# Patient Record
Sex: Female | Born: 1945 | Race: White | Hispanic: No | Marital: Married | Smoking: Never smoker
Health system: Southern US, Community
[De-identification: ages and names within clinical notes are randomized; demographics above are authoritative.]

## PROBLEM LIST (undated history)

## (undated) DIAGNOSIS — K5790 Diverticulosis of intestine, part unspecified, without perforation or abscess without bleeding: Secondary | ICD-10-CM

## (undated) DIAGNOSIS — F32A Depression, unspecified: Secondary | ICD-10-CM

## (undated) DIAGNOSIS — Z8719 Personal history of other diseases of the digestive system: Secondary | ICD-10-CM

## (undated) DIAGNOSIS — M1990 Unspecified osteoarthritis, unspecified site: Secondary | ICD-10-CM

## (undated) DIAGNOSIS — Z9889 Other specified postprocedural states: Secondary | ICD-10-CM

## (undated) DIAGNOSIS — K219 Gastro-esophageal reflux disease without esophagitis: Secondary | ICD-10-CM

## (undated) DIAGNOSIS — F329 Major depressive disorder, single episode, unspecified: Secondary | ICD-10-CM

## (undated) HISTORY — DX: Major depressive disorder, single episode, unspecified: F32.9

## (undated) HISTORY — DX: Personal history of other diseases of the digestive system: Z87.19

## (undated) HISTORY — DX: Depression, unspecified: F32.A

## (undated) HISTORY — DX: Other specified postprocedural states: Z98.890

## (undated) HISTORY — DX: Unspecified osteoarthritis, unspecified site: M19.90

## (undated) HISTORY — DX: Gastro-esophageal reflux disease without esophagitis: K21.9

## (undated) HISTORY — DX: Diverticulosis of intestine, part unspecified, without perforation or abscess without bleeding: K57.90

---
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
## 1983-08-20 HISTORY — PX: WRIST SURGERY: SHX841

## 2001-08-19 HISTORY — PX: TONSILLECTOMY: SUR1361

## 2005-08-19 DIAGNOSIS — K5790 Diverticulosis of intestine, part unspecified, without perforation or abscess without bleeding: Secondary | ICD-10-CM

## 2005-08-19 DIAGNOSIS — Z8719 Personal history of other diseases of the digestive system: Secondary | ICD-10-CM

## 2005-08-19 HISTORY — DX: Diverticulosis of intestine, part unspecified, without perforation or abscess without bleeding: K57.90

## 2005-08-19 HISTORY — DX: Personal history of other diseases of the digestive system: Z87.19

## 2006-04-22 ENCOUNTER — Ambulatory Visit: Payer: Self-pay | Admitting: Gastroenterology

## 2006-05-27 ENCOUNTER — Encounter (INDEPENDENT_AMBULATORY_CARE_PROVIDER_SITE_OTHER): Payer: Self-pay | Admitting: Specialist

## 2006-05-27 ENCOUNTER — Ambulatory Visit: Payer: Self-pay | Admitting: Gastroenterology

## 2006-06-27 ENCOUNTER — Ambulatory Visit: Payer: Self-pay | Admitting: Gastroenterology

## 2006-11-05 ENCOUNTER — Other Ambulatory Visit: Admission: RE | Admit: 2006-11-05 | Discharge: 2006-11-05 | Payer: Self-pay | Admitting: Obstetrics and Gynecology

## 2007-03-27 ENCOUNTER — Encounter: Admission: RE | Admit: 2007-03-27 | Discharge: 2007-03-27 | Payer: Self-pay | Admitting: Obstetrics and Gynecology

## 2007-07-28 ENCOUNTER — Ambulatory Visit: Payer: Self-pay | Admitting: Gastroenterology

## 2007-08-20 HISTORY — PX: UPPER GASTROINTESTINAL ENDOSCOPY: SHX188

## 2007-08-20 HISTORY — PX: CHOLECYSTECTOMY: SHX55

## 2007-10-21 ENCOUNTER — Ambulatory Visit (HOSPITAL_COMMUNITY): Admission: RE | Admit: 2007-10-21 | Discharge: 2007-10-21 | Payer: Self-pay | Admitting: Gastroenterology

## 2007-11-12 ENCOUNTER — Other Ambulatory Visit: Admission: RE | Admit: 2007-11-12 | Discharge: 2007-11-12 | Payer: Self-pay | Admitting: Obstetrics and Gynecology

## 2007-12-08 ENCOUNTER — Ambulatory Visit: Payer: Self-pay | Admitting: Gastroenterology

## 2007-12-10 ENCOUNTER — Ambulatory Visit (HOSPITAL_COMMUNITY): Admission: RE | Admit: 2007-12-10 | Discharge: 2007-12-10 | Payer: Self-pay | Admitting: Gastroenterology

## 2008-01-01 ENCOUNTER — Encounter: Payer: Self-pay | Admitting: Gastroenterology

## 2008-01-21 ENCOUNTER — Encounter: Payer: Self-pay | Admitting: Gastroenterology

## 2008-05-27 ENCOUNTER — Encounter: Admission: RE | Admit: 2008-05-27 | Discharge: 2008-05-27 | Payer: Self-pay | Admitting: Obstetrics and Gynecology

## 2008-05-27 ENCOUNTER — Ambulatory Visit: Payer: Self-pay | Admitting: Gastroenterology

## 2008-06-08 ENCOUNTER — Ambulatory Visit: Payer: Self-pay | Admitting: Gastroenterology

## 2008-06-08 ENCOUNTER — Encounter: Payer: Self-pay | Admitting: Gastroenterology

## 2008-06-10 ENCOUNTER — Encounter: Payer: Self-pay | Admitting: Gastroenterology

## 2008-09-12 ENCOUNTER — Telehealth: Payer: Self-pay | Admitting: Gastroenterology

## 2008-11-15 ENCOUNTER — Other Ambulatory Visit: Admission: RE | Admit: 2008-11-15 | Discharge: 2008-11-15 | Payer: Self-pay | Admitting: Obstetrics and Gynecology

## 2009-04-25 IMAGING — US US ABDOMEN COMPLETE
1 series · 14 of 25 positions shown · non-contrast
Comparison: None.

CLINICAL DATA: Abdominal pain.
 ABDOMEN ULTRASOUND:
TECHNIQUE: Complete abdominal ultrasound examination was performed including evaluation of the liver, gallbladder, bile ducts, pancreas, kidneys, spleen, IVC, and abdominal aorta.

[Series 1: unknown · 0.28mm/px · 14 of 67 slices shown]
[im 1/67]
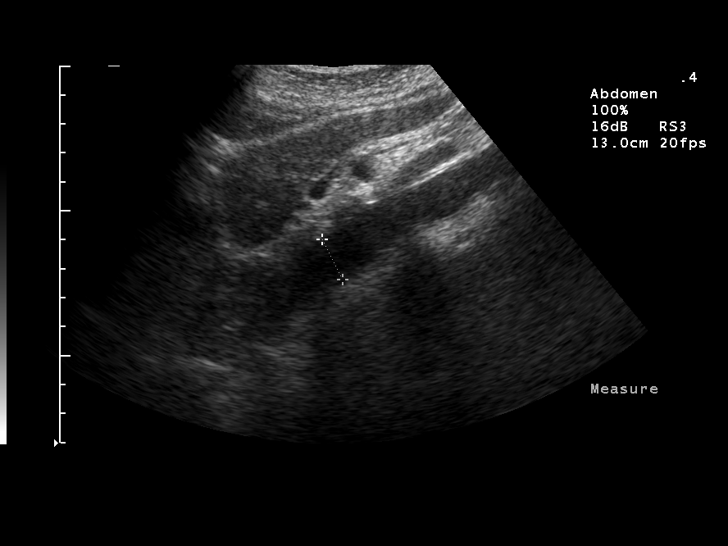
[im 6/67]
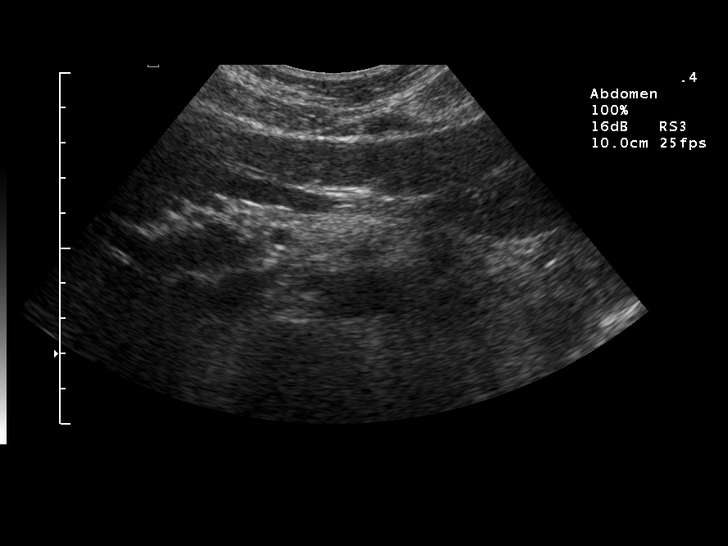
[im 12/67]
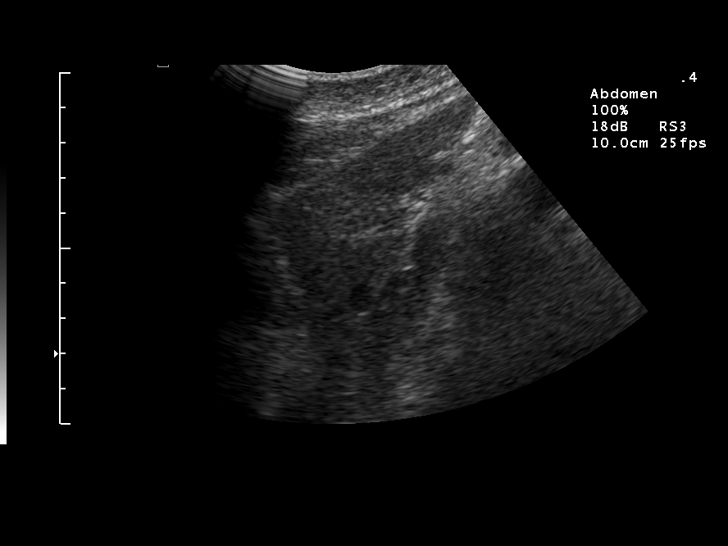
[im 17/67]
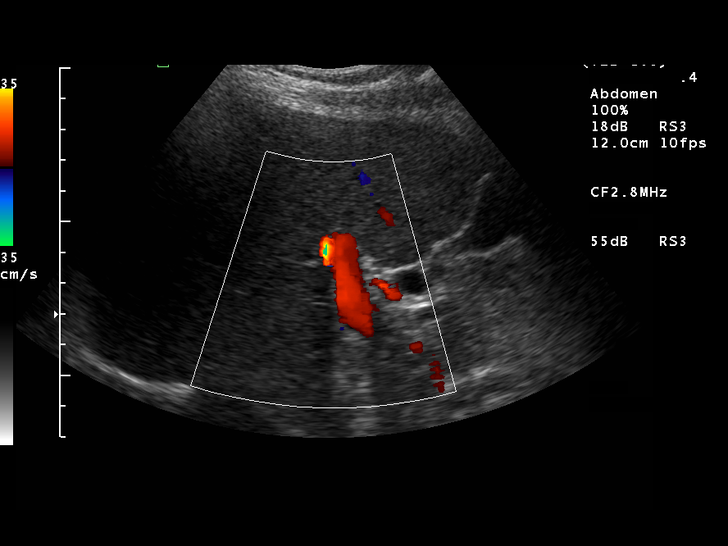
[im 23/67]
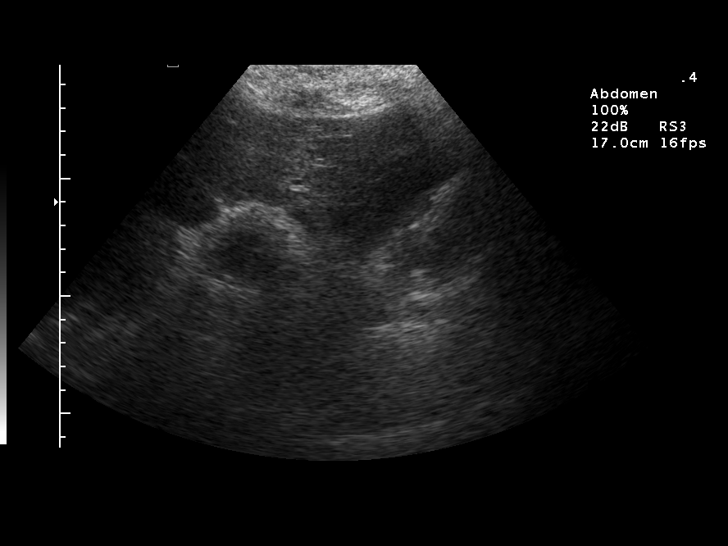
[im 25/67]
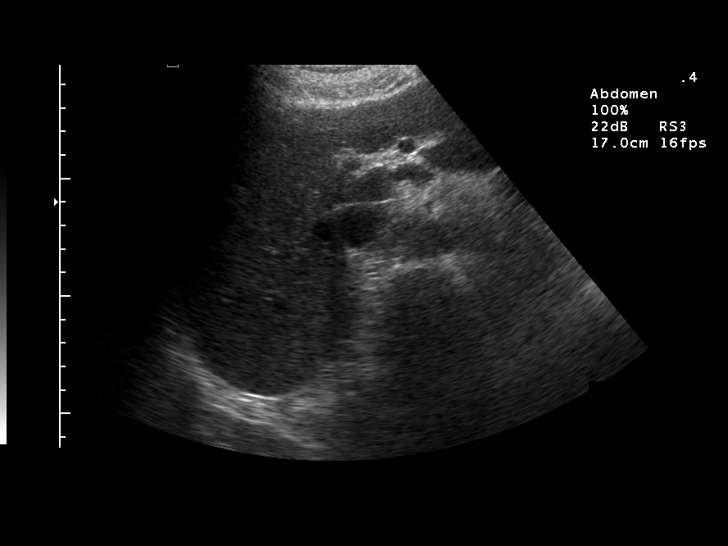
[im 31/67]
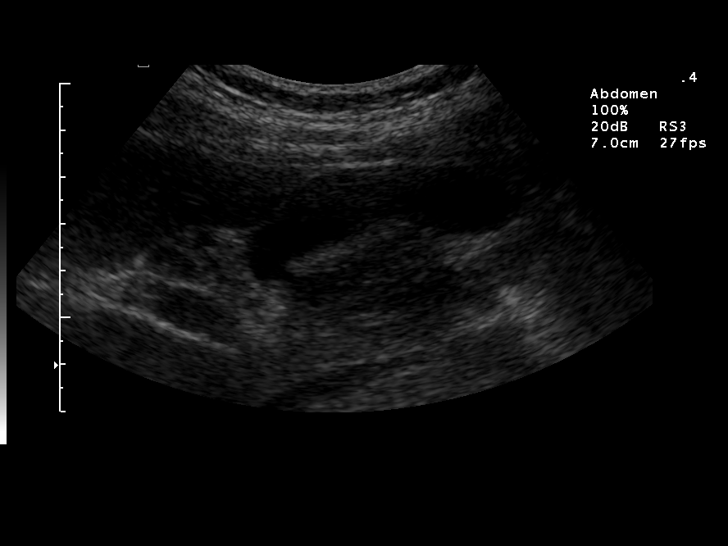
[im 36/67]
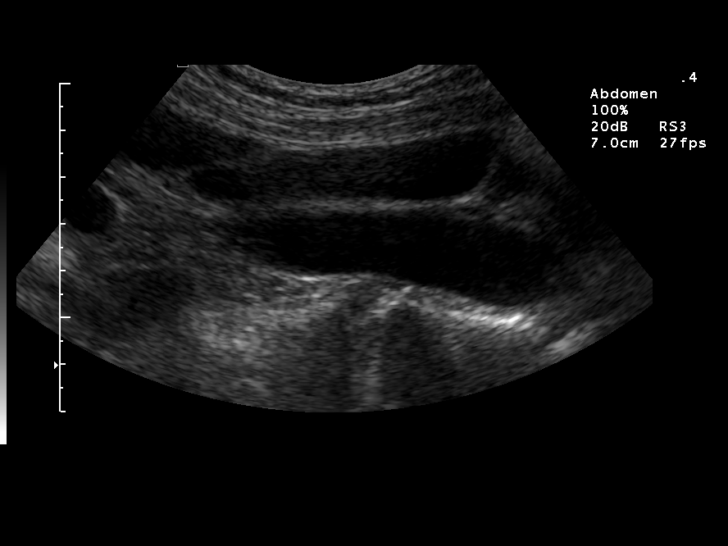
[im 42/67]
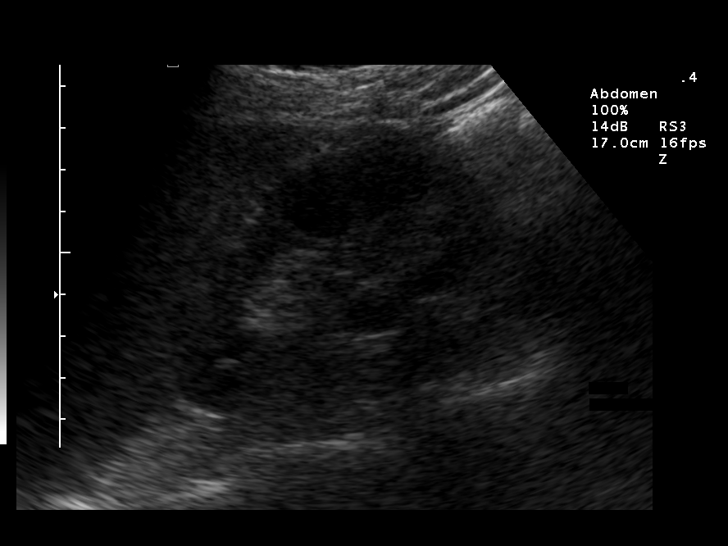
[im 45/67]
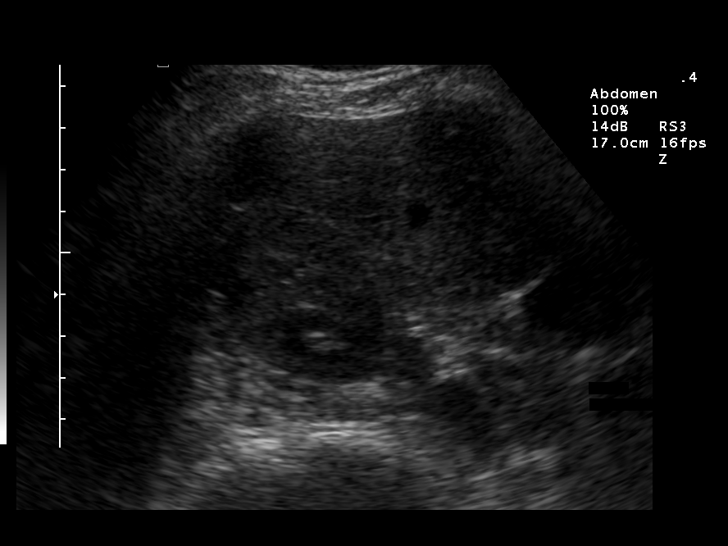
[im 50/67]
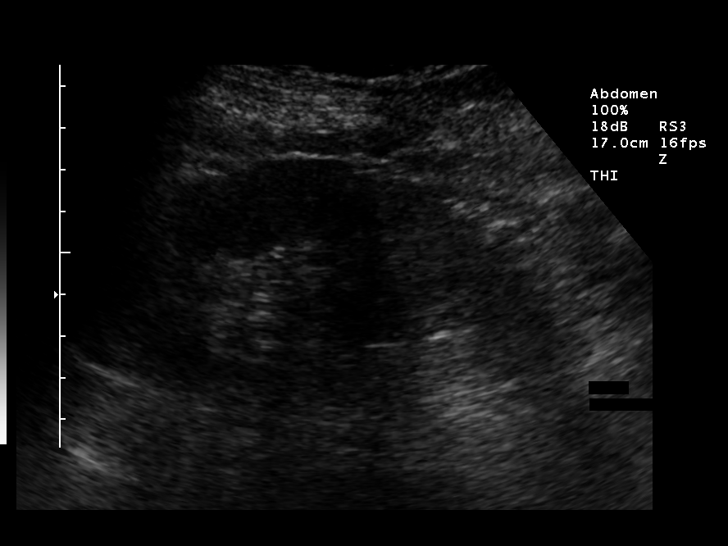
[im 56/67]
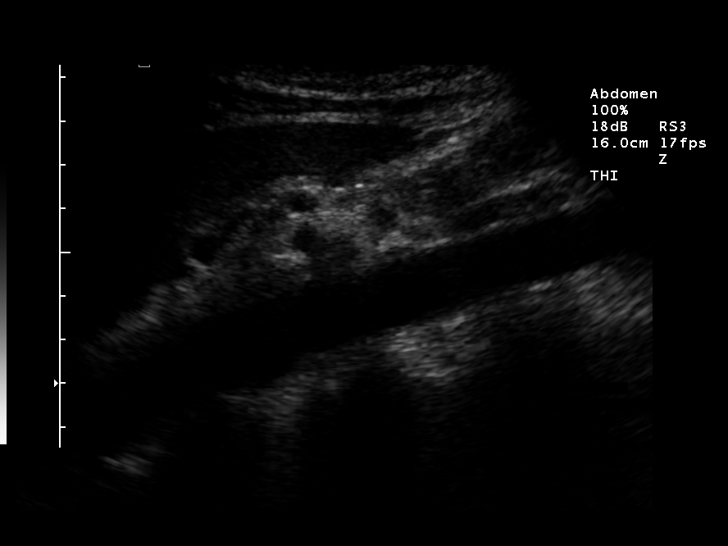
[im 61/67]
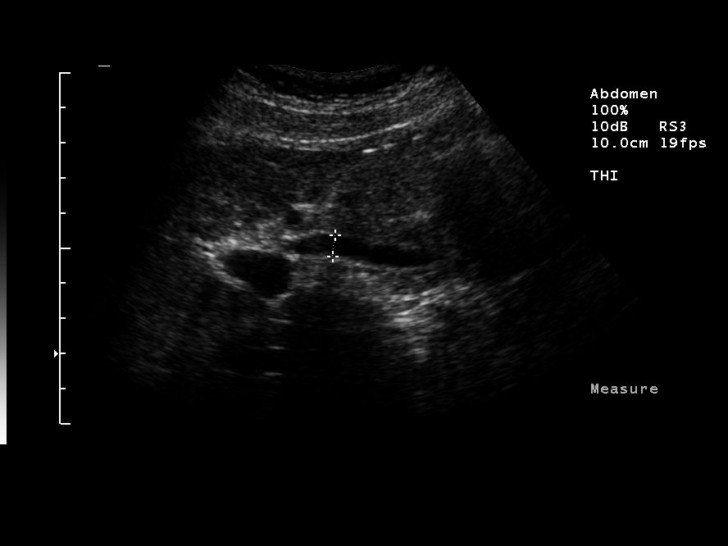
[im 67/67]
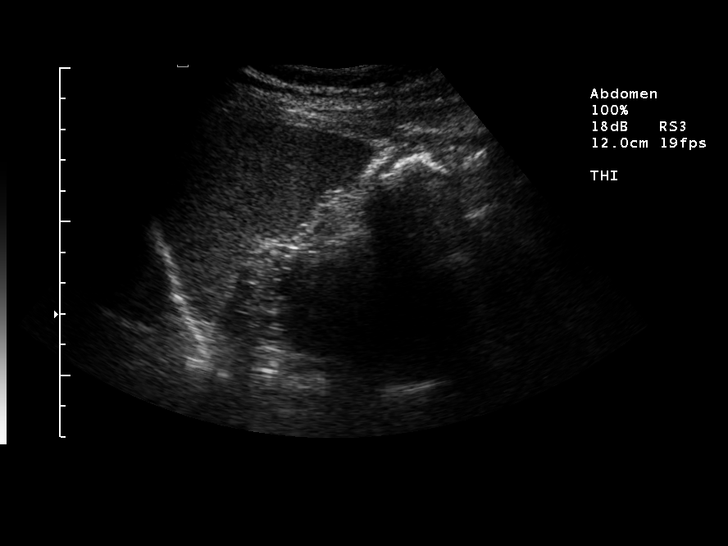

[14 of 25 positions shown; findings below may reference images not displayed]

FINDINGS: Liver, gallbladder, extrahepatic bile duct, IVC, pancreas, spleen, kidneys and aorta are unremarkable.
IMPRESSION: No acute findings.

## 2009-11-21 ENCOUNTER — Encounter: Admission: RE | Admit: 2009-11-21 | Discharge: 2009-11-21 | Payer: Self-pay | Admitting: Obstetrics and Gynecology

## 2009-11-21 ENCOUNTER — Other Ambulatory Visit: Admission: RE | Admit: 2009-11-21 | Discharge: 2009-11-21 | Payer: Self-pay | Admitting: Obstetrics and Gynecology

## 2010-11-06 ENCOUNTER — Other Ambulatory Visit (HOSPITAL_COMMUNITY): Payer: Self-pay | Admitting: Obstetrics and Gynecology

## 2010-11-06 DIAGNOSIS — Z1231 Encounter for screening mammogram for malignant neoplasm of breast: Secondary | ICD-10-CM

## 2011-01-01 ENCOUNTER — Ambulatory Visit: Payer: Self-pay

## 2011-01-01 NOTE — Assessment & Plan Note (Signed)
[facility]                         GASTROENTEROLOGY OFFICE NOTE   REVER, PICHETTE                       MRN:          [id number]  DATE:12/08/2007                            DOB:          Sep 08, 1945    Cheryl Mclaughlin had been doing well until recently when she has had recurrent  right upper quadrant discomfort described as a dull aching sensation  anywhere from a 3 to a 7/10 without other associated symptoms except for  occasional discomfort with deep breathing.  She denies musculoskeletal  injury or trauma.  She has had no specific hepatobiliary complaints and  is on chronic Prevacid therapy because of her reflux and short segment  of Barrett's mucosa.  She had previous ultrasound in March of 2009 that  was normal as were liver function tests.  She has seen her gynecologist  and has a pelvic ultrasound scheduled at this time.   Patient has no history of diabetes and recent lipid profiles showed no  marked hyperlipidemia.  Last liver profile in June 2008, was normal.   MEDICATIONS:  In addition to her Prevacid, she takes  1. Aspirin 81 mg a day.  2. Omega III daily.  3. Prempro 0.625 mg a day.  4. Celexa 40 mg a day.  5. Multivitamins.   ALLERGIES:  She said she has had reactions to MINOCIN.   PHYSICAL EXAMINATION:  VITAL SIGNS:  She weighs 128 pounds and blood  pressure 106/60, pulse 64 and regular.  GENERAL APPEARANCE:  She is awake, alert, in no acute distress.  HEENT:  There were no stigmata of chronic liver disease.  CHEST:  Clear to percussion and auscultation.  CARDIOVASCULAR:  She was in a regular rhythm without murmurs, rubs, or  gallops.  ABDOMEN:  Soft and nontender without organomegaly, masses, bowel sounds  were normal.  There was no CVA tenderness or back abnormalities.  EXTREMITIES:  Unremarkable.  NEUROLOGIC:  Mental status was clear.   ASSESSMENT:  1. 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  2. Current right upper quadrant pain - rule out gallbladder      dysfunction versus gas trapping and irritable bowel syndrome.  Her      colonoscopy does reveal that she had a very long, redundant and      tortuous colon and she does have some irritable bowel syndrome      symptoms.   RECOMMENDATIONS:  1. High fiber diet daily.  Daily Benefiber and p.r.n. sublingual      Levsin.  2. Continue reflux regime with daily PPI therapy.  3. Check outpatient [facility] HIDA scanning.     Cheryl Mclaughlin. Jarold Motto, MD, Caleen Essex, FAGA  Electronically Signed    DRP/MedQ  DD: 12/08/2007  DT: 12/08/2007  Job #: [id number]

## 2011-01-01 NOTE — Assessment & Plan Note (Signed)
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  She has been on  Prevacid 30 mg a day and is asymptomatic at this time and comes in for  followup exam.  She denies symptoms as long as she takes her Aciphex  therapy.  She has had some right upper quadrant pain of an intermittent  nature without associated hepatobiliary or lower GI complaints.  She did  have a previous colonoscopy exam also that was unremarkable except for  left sided rather marked diverticulosis.  She is currently following a  high fiber diet.   In addition to her Prevacid she takes:  1. Aspirin 81 mg a day.  2. Prempro 0.625 mg a day.  3. Celexa 40 mg a day.  4. Allegra daily.  5. Cinnamon capsules.   She says she has never had known gallbladder or liver problems and has  not had previous ultrasound exams.   FAMILY HISTORY:  Unremarkable for gastrointestinal problems.   PHYSICAL EXAMINATION:  GENERAL:  She is an attractive healthy-appearing  white female in no distress, appearing her stated age.  VITAL SIGNS:  She weighs 128 pounds, blood pressure 100/60, and pulse  was 66 and regular.  ABDOMEN:  I could not appreciate hepatosplenomegaly, abdominal masses,  or tenderness.  Bowel sounds were normal.   ASSESSMENT:  1. Chronic gastroesophageal reflux disease doing well on Prevacid      therapy.  2. Short segment of Barrett's mucosa.  3. Rule out cholelithiasis or infiltrative liver disease.   RECOMMENDATIONS:  1. Reflux regimen reviewed with the patient,  renewed Prevacid.  2. Followup endoscopy in October 2009.  3. Check up abdominal ultrasound exam.     Vania Rea. Jarold Motto, MD, Caleen Essex, FAGA  Electronically Signed    DRP/MedQ  DD: 07/28/2007  DT: 07/28/2007  Job #: [id number]   cc:   Donata Duff

## 2011-01-04 NOTE — Assessment & Plan Note (Signed)
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  She is self  referred today for evaluation of intermittent dysphagia for solid foods in  the mid sternal area.  This is a very periodic problem and is not associated  with chronic acid reflux.  She has noticed a dull full sensation in her  right upper quadrant area with occasional worsening over the last 6 months.  However, she has not had bowel changes, melena, or hematochezia, or any  hepatobiliary complaints.  She does have mild constipation and some gas  bloating, however.  She has never had a colonoscopy or upper GI endoscopy,  or other x-rays of her gut.  Her appetite is good, and her weight is stable,  and she denies specific food intolerances.   PAST MEDICAL HISTORY:  1. Mild depression.  2. Allergic sinusitis.  3. Previous polio in 1948, in her left arm and hand which has required      some orthopedic procedures.  4. D&C in 1998.  5. Surgery on her right fibula in December 2006.   MEDICATIONS:  1. Aspirin 81 mg a day.  2. Allegra D every 12 hours.  3. Centrum Silver.  4. Omega-3 vitamins.  5. Prempro 0.625 mg a day.  6. Celexa 40 mg a day.  7. Bayer Plus 500 mg a day.  8. She previously was on Fosamax which she has been off of for the last 8-      9 months.   IN THE PAST SHE HAS HAD SOME REACTION TO MINOCIN.   FAMILY HISTORY:  Noncontributory.   SOCIAL HISTORY:  The patient is married, lives with her husband and has [name].  She does not smoke or abuse ethanol.   REVIEW OF SYSTEMS:  The patient does complain of a frequent dry cough  related to chronic sinus drainage.  She has some  arthritis in her right  hand, has mild insomnia.  She denies any other cardiovascular, pulmonary,  genitourinary, neurologic, orthopedic, or endocrine problems. Review of  systems otherwise noncontributory.   PHYSICAL EXAMINATION:  GENERAL:  Shows her to be an attractive appearing  white female, appears her stated age, in no distress.  I could not  appreciate stigmata of chronic liver disease.  VITAL SIGNS:  She is 5 feet 6 inches tall and weighs 127 pounds.  Blood  pressure is 136/78, pulse was 86 and regular.  NECK:  There is no thyromegaly or lymphadenopathy noted.  CHEST:  Entirely clear to percussion and auscultation.  HEART:  There were no murmurs, gallops, or rubs noted.  She appeared to be  in a regular rhythm.  ABDOMEN:  I could not appreciate hepatosplenomegaly, abdominal masses, or  tenderness.  Bowel sounds were normal.  EXTREMITIES:  Unremarkable.  MENTAL STATUS:  Normal.  RECTAL:  Deferred.   LABORATORY DATA:  The patient on June 12, 2005, had a normal CBC and  metabolic profile including liver function test.  Total cholesterol at that  time was 227 mg percent.   ASSESSMENT:  1. Need for screening colonoscopy because of her age and mild      constipation.  2. Intermittent dysphagia, probable Schatzki ring in the distal esophagus,      rule out obstructive lesion.  3. History of chronic depression.  4. Vague right upper quadrant discomfort of unexplained etiology.   RECOMMENDATIONS:  1. Outpatient endoscopy and colonoscopy at her convenience.  2. Continue other medications per primary care physicians.                                   Vania Rea. Jarold Motto, MD, Clementeen Graham, [place]   DRP/MedQ  DD:  04/22/2006  DT:  04/22/2006  Job #:  [id number]   cc:   Nicholos Johns, MD

## 2011-01-04 NOTE — Assessment & Plan Note (Signed)
[facility]                           GASTROENTEROLOGY OFFICE NOTE   [name], [name]                       MRN:          [id number]  DATE:06/27/2006                            DOB:          1945-12-29    Cheryl Mclaughlin returns today, is much improved on daily Prevacid and an antireflux  regimen.  Her endoscopy on May 27, 2006 showed a several centimeter  hiatal hernia, distal esophageal stricture, and Barrett's mucosa distal  esophagus seen with biopsies of the esophagus.  She had her esophageal  stricture dilated without difficulty.  Endoscopy otherwise was unremarkable  except for a 5 cm hiatal hernia.  She also had colonoscopy that was normal  except for a long and redundant colon with rather extensive diverticulosis.   We had a long talk today about acid reflux and its management and about  Barrett's mucosa.  She will have followup endoscopy in 2 years' time, is to  continue on chronic reflux regimen along with daily Prevacid in the interim.  She has some extra esophageal manifestations of GERD and fits mostly by  choking and phlegm production upper throat.  If this does not improve on  proton pump inhibitor therapy over the next several months, I have suggested  that she have ENT consultation.  She is to continue her other multiple  medications as per Dr. Mora Appl.  We will see her on an as needed basis as  needed.     Vania Rea. Jarold Motto, MD, Caleen Essex, FAGA  Electronically Signed    DRP/MedQ  DD: 06/27/2006  DT: 06/27/2006  Job #: [id number]   cc:   Donata Duff

## 2013-03-29 ENCOUNTER — Encounter: Payer: Self-pay | Admitting: Gastroenterology

## 2013-04-20 ENCOUNTER — Ambulatory Visit: Payer: Self-pay | Admitting: Gastroenterology

## 2013-09-13 ENCOUNTER — Other Ambulatory Visit: Payer: Self-pay

## 2013-09-13 DIAGNOSIS — Z1231 Encounter for screening mammogram for malignant neoplasm of breast: Secondary | ICD-10-CM

## 2013-10-06 ENCOUNTER — Ambulatory Visit: Payer: Self-pay

## 2014-05-31 ENCOUNTER — Ambulatory Visit
Admission: RE | Admit: 2014-05-31 | Discharge: 2014-05-31 | Disposition: A | Discharge status: Home / Self Care | Payer: Medicare Other | Source: Ambulatory Visit

## 2014-05-31 DIAGNOSIS — Z1231 Encounter for screening mammogram for malignant neoplasm of breast: Secondary | ICD-10-CM

## 2014-10-13 ENCOUNTER — Encounter: Payer: Self-pay | Admitting: Gastroenterology

## 2014-11-30 ENCOUNTER — Encounter: Payer: Self-pay | Admitting: Gastroenterology

## 2014-11-30 ENCOUNTER — Ambulatory Visit (INDEPENDENT_AMBULATORY_CARE_PROVIDER_SITE_OTHER): Payer: Medicare Other | Admitting: Gastroenterology

## 2014-11-30 VITALS — BP 132/82 | HR 72 | Ht 65.25 in | Wt 133.5 lb

## 2014-11-30 DIAGNOSIS — R1314 Dysphagia, pharyngoesophageal phase: Secondary | ICD-10-CM | POA: Insufficient documentation

## 2014-11-30 DIAGNOSIS — K219 Gastro-esophageal reflux disease without esophagitis: Secondary | ICD-10-CM

## 2014-11-30 DIAGNOSIS — Z1211 Encounter for screening for malignant neoplasm of colon: Secondary | ICD-10-CM | POA: Diagnosis not present

## 2014-11-30 HISTORY — DX: Gastro-esophageal reflux disease without esophagitis: K21.9

## 2014-11-30 NOTE — Assessment & Plan Note (Signed)
GERD may be contributing to patient's cough.  Results of dilation therapy will continue Prevacid.

## 2014-11-30 NOTE — Assessment & Plan Note (Signed)
Suspect dysphagia solids and postprandial coughing I related to a recurrent esophageal stricture.    Recommendations #1 EGD with dilation as indicated

## 2014-11-30 NOTE — Patient Instructions (Signed)
You have been scheduled for an endoscopy. Please follow written instructions given to you at your visit today. If you use inhalers (even only as needed), please bring them with you on the day of your procedure.   

## 2014-11-30 NOTE — Progress Notes (Signed)
_                                                                                                                History of Present Illness:  Cheryl Mclaughlin is a 69 year old white female with history of an esophageal stricture referred at the request of Dr. Karlton LemonMacleod for evaluation of dysphagia and coughing.  She's experiencing mild dysphagia to solids.  With swallowing she has frequent coughing and the need to clear her throat.  She denies pyrosis, per se.  She remains on Prevacid area she denies sore throat, hoarseness or odynophagia.   Past Medical History  Diagnosis Date  . Arthritis   . Depression   . Diverticulosis 2007  . Status post dilation of esophageal narrowing 2007   Past Surgical History  Procedure Laterality Date  . Cholecystectomy  2009   family history includes Colon cancer in her maternal aunt; Diabetes in her maternal grandfather; Heart disease in her maternal grandmother, mother, and paternal grandfather. There is no history of Colon polyps, Kidney disease, or Esophageal cancer. Current Outpatient Prescriptions  Medication Sig Dispense Refill  . Cholecalciferol (VITAMIN D3) 1000 UNITS CAPS Take 1 capsule by mouth daily.    . citalopram (CELEXA) 40 MG tablet Take 40 mg by mouth daily.     . fexofenadine (ALLEGRA) 180 MG tablet Take 180 mg by mouth daily.    . lansoprazole (PREVACID) 30 MG capsule Take 30 mg by mouth daily.     . LOW-DOSE ASPIRIN PO Take 30 mg by mouth daily.    . Multiple Vitamins-Minerals (MULTIVITAMIN ADULT) TABS Take 1 tablet by mouth daily. Pt takes Centrum Silver    . Omega-3 Fatty Acids (FISH OIL) 1000 MG CAPS Take 1 capsule by mouth daily.     No current facility-administered medications for this visit.   Allergies as of 11/30/2014 - Review Complete 11/30/2014  Allergen Reaction Noted  . Minocycline hcl  05/27/2008    reports that she has never smoked. She has never used smokeless tobacco. She reports that she does not drink  alcohol or use illicit drugs.   Review of Systems: Pertinent positive and negative review of systems were noted in the above HPI section. All other review of systems were otherwise negative.  Vital signs were reviewed in today's medical record Physical Exam: General: Well developed , well nourished, no acute distress Skin: anicteric Head: Normocephalic and atraumatic Eyes:  sclerae anicteric, EOMI Ears: Normal auditory acuity Mouth: No deformity or lesions Neck: Supple, no masses or thyromegaly Lungs: Clear throughout to auscultation Heart: Regular rate and rhythm; no murmurs, rubs or bruits Abdomen: Soft, non tender and non distended. No masses, hepatosplenomegaly or hernias noted. Normal Bowel sounds Rectal:deferred Musculoskeletal: Symmetrical with no gross deformities  Skin: No lesions on visible extremities Pulses:  Normal pulses noted Extremities: No clubbing, cyanosis, edema or deformities noted Neurological: Alert oriented x 4, grossly nonfocal Cervical Nodes:  No significant cervical adenopathy Inguinal Nodes: No significant inguinal adenopathy Psychological:  Alert and cooperative.  Normal mood and affect  See Assessment and Plan under Problem List

## 2014-11-30 NOTE — Assessment & Plan Note (Signed)
Colonoscopy 2017.

## 2015-02-09 ENCOUNTER — Encounter: Payer: Medicare Other | Admitting: Gastroenterology

## 2015-04-26 ENCOUNTER — Encounter: Payer: Self-pay | Admitting: Gastroenterology

## 2015-04-26 ENCOUNTER — Ambulatory Visit (AMBULATORY_SURGERY_CENTER): Payer: Medicare Other | Admitting: Gastroenterology

## 2015-04-26 VITALS — BP 116/67 | HR 61 | Temp 96.4°F | Resp 12 | Ht 65.25 in | Wt 133.0 lb

## 2015-04-26 DIAGNOSIS — K2970 Gastritis, unspecified, without bleeding: Secondary | ICD-10-CM | POA: Diagnosis not present

## 2015-04-26 DIAGNOSIS — Q394 Esophageal web: Secondary | ICD-10-CM | POA: Diagnosis not present

## 2015-04-26 DIAGNOSIS — R1314 Dysphagia, pharyngoesophageal phase: Secondary | ICD-10-CM

## 2015-04-26 DIAGNOSIS — K2950 Unspecified chronic gastritis without bleeding: Secondary | ICD-10-CM | POA: Diagnosis not present

## 2015-04-26 MED ORDER — SODIUM CHLORIDE 0.9 % IV SOLN: 500.0000 mL | INTRAVENOUS | Status: DC

## 2015-04-26 MED ORDER — LANSOPRAZOLE 30 MG PO CPDR: 30.0000 mg | DELAYED_RELEASE_CAPSULE | Freq: Every day | ORAL | Status: DC

## 2015-04-26 NOTE — Op Note (Addendum)
[facility] [address] [place], [address]   ENDOSCOPY PROCEDURE REPORT  PATIENT: Cheryl, Mclaughlin  MR#: [id number] BIRTHDATE: 1946/05/02 , 68  yrs. old GENDER: female ENDOSCOPIST: Louis Meckel, MD REFERRED BY: PROCEDURE DATE:  04/26/2015 PROCEDURE:  EGD w/ biopsy and Maloney dilation of esophagus ASA CLASS:     Class II INDICATIONS:  dysphagia. MEDICATIONS: Monitored anesthesia care and Propofol 125 mg IV TOPICAL ANESTHETIC:  DESCRIPTION OF PROCEDURE: After the risks benefits and alternatives of the procedure were thoroughly explained, informed consent was obtained.  The [id number] endoscope was introduced through the mouth and advanced to the second portion of the duodenum , Without limitations.  The instrument was slowly withdrawn as the mucosa was fully examined.      ESOPHAGUS: A  Schatzki ring was found at the gastroesophageal junction.  The stricture was dilated using a 18mm (52Fr) Maloney dilator.        In the gastric cardia there was a patchy area of eroded mucosa.  Biopsies were taken. The remainder the exam including the more distal stomach and duodenum were normal. Retroflexion in the gastric cardia did not reveal any other abnormalities.The scope was then withdrawn from the patient and the procedure completed.  COMPLICATIONS: There were no immediate complications.  ENDOSCOPIC IMPRESSION: 1.  Schatzki's ring?"status post Maloney dilation 2.  Patchy area of gastritis  RECOMMENDATIONS: 1.  repeat dilation as needed 2.  Await biopsy findings  REPEAT EXAM:  eSigned:  Louis Meckel, MD 04/26/2015 2:24 PM    CC:  DOCUMENT ADDENDUM eSigned:  Louis Meckel, MD 04/26/2015 2:29 PM  Reason for addendum:  Correction of inaccurate information  Recently acquired lab/pathology results  Additional information cc Donata Duff, MD Comments:     PATIENT NAME:  Cheryl, Mclaughlin MR#: [id number]

## 2015-04-26 NOTE — Progress Notes (Signed)
Called to room to assist during endoscopic procedure.  Patient ID and intended procedure confirmed with present staff. Received instructions for my participation in the procedure from the performing physician.  

## 2015-04-26 NOTE — Progress Notes (Signed)
Transferred to recovery room. A/O x3, pleased with MAC.  VSS.  Report to Shelia, RN. 

## 2015-04-26 NOTE — Patient Instructions (Signed)
YOU HAD AN ENDOSCOPIC PROCEDURE TODAY AT THE [facility]:   Refer to the procedure report that was given to you for any specific questions about what was found during the examination.  If the procedure report does not answer your questions, please call your gastroenterologist to clarify.  If you requested that your care partner not be given the details of your procedure findings, then the procedure report has been included in a sealed envelope for you to review at your convenience later.  YOU SHOULD EXPECT: Some feelings of bloating in the abdomen. Passage of more gas than usual.  Walking can help get rid of the air that was put into your GI tract during the procedure and reduce the bloating. If you had a lower endoscopy (such as a colonoscopy or flexible sigmoidoscopy) you may notice spotting of blood in your stool or on the toilet paper. If you underwent a bowel prep for your procedure, you may not have a normal bowel movement for a few days.  Please Note:  You might notice some irritation and congestion in your nose or some drainage.  This is from the oxygen used during your procedure.  There is no need for concern and it should clear up in a day or so.  SYMPTOMS TO REPORT IMMEDIATELY:     Following upper endoscopy (EGD)  Vomiting of blood or coffee ground material  New chest pain or pain under the shoulder blades  Painful or persistently difficult swallowing  New shortness of breath  Fever of 100F or higher  Black, tarry-looking stools  For urgent or emergent issues, a gastroenterologist can be reached at any hour by calling [phone].   DIET: Follow dilation diet handout  ACTIVITY:  You should plan to take it easy for the rest of today and you should NOT DRIVE or use heavy machinery until tomorrow (because of the sedation medicines used during the test).    FOLLOW UP: Our staff will call the number listed on your records the next business day following your  procedure to check on you and address any questions or concerns that you may have regarding the information given to you following your procedure. If we do not reach you, we will leave a message.  However, if you are feeling well and you are not experiencing any problems, there is no need to return our call.  We will assume that you have returned to your regular daily activities without incident.  If any biopsies were taken you will be contacted by phone or by letter within the next 1-3 weeks.  Please call us at [phone] if you have not heard about the biopsies in 3 weeks.    SIGNATURES/CONFIDENTIALITY: You and/or your care partner have signed paperwork which will be entered into your electronic medical record.  These signatures attest to the fact that that the information above on your After Visit Summary has been reviewed and is understood.  Full responsibility of the confidentiality of this discharge information lies with you and/or your care-partner.   Resume medications. Information given on gastritis and Dilation Diet.

## 2015-04-27 ENCOUNTER — Telehealth: Payer: Self-pay | Admitting: *Deleted

## 2015-04-27 ENCOUNTER — Telehealth: Payer: Self-pay | Admitting: Gastroenterology

## 2015-04-27 ENCOUNTER — Other Ambulatory Visit: Payer: Self-pay

## 2015-04-27 DIAGNOSIS — K2970 Gastritis, unspecified, without bleeding: Secondary | ICD-10-CM

## 2015-04-27 DIAGNOSIS — R1314 Dysphagia, pharyngoesophageal phase: Secondary | ICD-10-CM

## 2015-04-27 MED ORDER — LANSOPRAZOLE 30 MG PO CPDR: 30.0000 mg | DELAYED_RELEASE_CAPSULE | Freq: Every day | ORAL | Status: DC

## 2015-04-27 NOTE — Telephone Encounter (Signed)
rx sent to her mail order. Tried to contact the patient, but her number does not work.

## 2015-04-27 NOTE — Telephone Encounter (Signed)
  Follow up Call-no answer, left message to call if questions or concerns.     

## 2015-05-02 ENCOUNTER — Encounter: Payer: Self-pay | Admitting: Gastroenterology

## 2017-01-22 ENCOUNTER — Other Ambulatory Visit: Payer: Self-pay | Admitting: Family Medicine

## 2017-01-22 DIAGNOSIS — Z1231 Encounter for screening mammogram for malignant neoplasm of breast: Secondary | ICD-10-CM

## 2017-02-13 ENCOUNTER — Ambulatory Visit
Admission: RE | Admit: 2017-02-13 | Discharge: 2017-02-13 | Disposition: A | Discharge status: Home / Self Care | Payer: Medicare Other | Source: Ambulatory Visit | Attending: Family Medicine | Admitting: Family Medicine

## 2017-02-13 DIAGNOSIS — Z1231 Encounter for screening mammogram for malignant neoplasm of breast: Secondary | ICD-10-CM

## 2017-05-28 ENCOUNTER — Encounter: Payer: Self-pay | Admitting: Gastroenterology

## 2017-07-15 ENCOUNTER — Encounter: Payer: Medicare Other | Admitting: Gastroenterology

## 2018-01-24 ENCOUNTER — Other Ambulatory Visit: Payer: Self-pay | Admitting: *Deleted

## 2018-06-25 ENCOUNTER — Ambulatory Visit (INDEPENDENT_AMBULATORY_CARE_PROVIDER_SITE_OTHER): Payer: Medicare Other | Admitting: Gastroenterology

## 2018-06-25 ENCOUNTER — Encounter: Payer: Self-pay | Admitting: Gastroenterology

## 2018-06-25 ENCOUNTER — Encounter (INDEPENDENT_AMBULATORY_CARE_PROVIDER_SITE_OTHER): Payer: Self-pay

## 2018-06-25 VITALS — BP 130/70 | HR 76 | Ht 65.5 in | Wt 139.4 lb

## 2018-06-25 DIAGNOSIS — R1310 Dysphagia, unspecified: Secondary | ICD-10-CM | POA: Diagnosis not present

## 2018-06-25 DIAGNOSIS — R1319 Other dysphagia: Secondary | ICD-10-CM

## 2018-06-25 DIAGNOSIS — K219 Gastro-esophageal reflux disease without esophagitis: Secondary | ICD-10-CM | POA: Diagnosis not present

## 2018-06-25 NOTE — Progress Notes (Signed)
[facility] Consult Note:  History: Cheryl Mclaughlin 06/25/2018  Referring physician: Donata Duff, MD  Reason for consult/chief complaint: Gastroesophageal Reflux (c/o sour taste in mouth. ); Dysphagia (c/o food moving slowly down); and Tongue sores (have improved since starting on B12 injections 4 months ago)   Subjective  HPI:  This is a very pleasant 72 year old woman self-referred for history of reflux, concern for Barrett's esophagus, and dysphagia. Upper endoscopy by Dr. Arlyce Dice 04/26/2015 revealed widely patent Schatzki ring, bougie dilation performed.  The patient had upper endoscopies in 2009 and 2007 as well.  Last colonoscopy 05/27/2006 by Dr. Jarold Motto, fair preparation, reportedly redundant and tortuous colon with pandiverticulosis.  I reviewed previous endoscopy reports in 2007, where it was noted under the pertinent negatives that there was no Barrett's esophagus, same report again at 2009.  Despite that, biopsies were taken that said Barrett's esophagus on the 2007 exam, but none on the 2009 exam.  This led to the visions concern that she had Barrett's that then went away and could possibly be back now. Cheryl Mclaughlin has been experiencing more frequent heartburn, typically in the evening.  She also had some tongue and mouth sores that apparently improved since starting B12 injection several months ago.  She has a sour taste in her mouth at times, and feels as if food may be moving more slowly through the esophagus, but that has not been a consistent symptom. She denies vomiting, loss of appetite or weight loss.  Bowel habits have been regular without rectal bleeding.  She also reports a negative Cologuard test done by primary care last year.  ROS:  Review of Systems Positive for arthralgias and depression She denies exertional chest pain, dyspnea or dysuria Remainder of systems negative except as above  Past Medical History: Past Medical History:    Diagnosis Date  . Arthritis   . Depression   . Diverticulosis 2007  . GERD (gastroesophageal reflux disease) 11/30/2014  . Status post dilation of esophageal narrowing 2007     Past Surgical History: Past Surgical History:  Procedure Laterality Date  . CHOLECYSTECTOMY  2009  . FOREARM SURGERY  1950's   left arm from polio  . TONSILLECTOMY  2003   right tonsil  . UPPER GASTROINTESTINAL ENDOSCOPY  2009   dilation  . WRIST SURGERY  1985   right hand     Family History: Family History  Problem Relation Age of Onset  . Colon cancer Maternal Aunt   . Diabetes Maternal Grandfather        All siblings  . Heart disease Mother   . Heart disease Maternal Grandmother   . Heart disease Paternal Grandfather   . Colon polyps Neg Hx   . Kidney disease Neg Hx   . Esophageal cancer Neg Hx   . Stomach cancer Neg Hx     Social History: Social History   Socioeconomic History  . Marital status: Married    Spouse name: Not on file  . Number of children: 0  . Years of education: Not on file  . Highest education level: Not on file  Occupational History  . Occupation: Retired  Engineer, production  . Financial resource strain: Not on file  . Food insecurity:    Worry: Not on file    Inability: Not on file  . Transportation needs:    Medical: Not on file    Non-medical: Not on file  Tobacco Use  . Smoking status: Never Smoker  . Smokeless tobacco: Never  Used  Substance and Sexual Activity  . Alcohol use: No    Alcohol/week: 0.0 standard drinks  . Drug use: No  . Sexual activity: Not on file  Lifestyle  . Physical activity:    Days per week: Not on file    Minutes per session: Not on file  . Stress: Not on file  Relationships  . Social connections:    Talks on phone: Not on file    Gets together: Not on file    Attends religious service: Not on file    Active member of club or organization: Not on file    Attends meetings of clubs or organizations: Not on file     Relationship status: Not on file  Other Topics Concern  . Not on file  Social History Narrative  . Not on file    Allergies: Allergies  Allergen Reactions  . Minocycline Hcl     REACTION: stiff joints    Outpatient Meds: Current Outpatient Medications  Medication Sig Dispense Refill  . citalopram (CELEXA) 40 MG tablet Take 40 mg by mouth daily.     . Cyanocobalamin (B-12 COMPLIANCE INJECTION IJ) Inject as directed every 30 (thirty) days.    . fexofenadine (ALLEGRA) 180 MG tablet Take 180 mg by mouth daily.    . LOW-DOSE ASPIRIN PO Take 81 mg by mouth daily.     . Multiple Vitamins-Minerals (VITAMIN D3 COMPLETE PO) Take 2,000 mg by mouth daily.     No current facility-administered medications for this visit.       ___________________________________________________________________ Objective   Exam:  BP 130/70   Pulse 76   Ht 5' 5.5" (1.664 m)   Wt 139 lb 6 oz (63.2 kg)   BMI 22.84 kg/m    General: this is a(n) well-appearing, normal vocal quality  Eyes: sclera anicteric, no redness  ENT: oral mucosa moist without lesions, no cervical or supraclavicular lymphadenopathy, good dentition  CV: RRR without murmur, S1/S2, no JVD, no peripheral edema  Resp: clear to auscultation bilaterally, normal RR and effort noted  GI: soft, no tenderness, with active bowel sounds. No guarding or palpable organomegaly noted.  Skin; warm and dry, no rash or jaundice noted  Neuro: awake, alert and oriented x 3. Normal gross motor function and fluent speech  Labs:  Previous endoscopy and pathology reports as noted above Assessment: Encounter Diagnoses  Name Primary?  . Gastroesophageal reflux disease, esophagitis presence not specified Yes  . Esophageal dysphagia     Recently worsened reflux symptoms.  She has been taking Tums as needed, but has had to do so more frequently than in the past.  Symptoms have definitely been worsening,, but she had not wanted to take a PPI out  of concern for some data reports lately.  She had previously been on Prevacid for years after initially being told she had Barrett's esophagus, then Dr. Arlyce Dice took her off it a few years back.  Plan:  Upper endoscopy with possible dilation.  I think this will help make a decision about medicines, depending upon whether or not esophagitis is seen.  She is agreeable after discussion of procedure and risks.  The benefits and risks of the planned procedure were described in detail with the patient or (when appropriate) their health care proxy.  Risks were outlined as including, but not limited to, bleeding, infection, perforation, adverse medication reaction leading to cardiac or pulmonary decompensation, or pancreatitis (if ERCP).  The limitation of incomplete mucosal visualization was also discussed.  No guarantees or warranties were given.   Thank you for the courtesy of this consult.  Please call me with any questions or concerns.  Charlie Pitter III  CC: Donata Duff, MD

## 2018-06-25 NOTE — Patient Instructions (Signed)
If you are age 72 or older, your body mass index should be between 23-30. Your Body mass index is 22.84 kg/m. If this is out of the aforementioned range listed, please consider follow up with your Primary Care Provider.  If you are age 39 or younger, your body mass index should be between 19-25. Your Body mass index is 22.84 kg/m. If this is out of the aformentioned range listed, please consider follow up with your Primary Care Provider.   You have been scheduled for an endoscopy. Please follow written instructions given to you at your visit today. If you use inhalers (even only as needed), please bring them with you on the day of your procedure. Your physician has requested that you go to www.startemmi.com and enter the access code given to you at your visit today. This web site gives a general overview about your procedure. However, you should still follow specific instructions given to you by our office regarding your preparation for the procedure.  It was a pleasure to see you today!  Dr. Myrtie Neither

## 2018-07-24 ENCOUNTER — Encounter: Payer: Self-pay | Admitting: Gastroenterology

## 2018-07-24 ENCOUNTER — Ambulatory Visit (AMBULATORY_SURGERY_CENTER): Payer: Medicare Other | Admitting: Gastroenterology

## 2018-07-24 VITALS — BP 117/55 | HR 66 | Temp 98.7°F | Resp 19 | Ht 65.0 in | Wt 139.0 lb

## 2018-07-24 DIAGNOSIS — R1310 Dysphagia, unspecified: Secondary | ICD-10-CM | POA: Diagnosis not present

## 2018-07-24 DIAGNOSIS — K449 Diaphragmatic hernia without obstruction or gangrene: Secondary | ICD-10-CM | POA: Diagnosis not present

## 2018-07-24 DIAGNOSIS — K219 Gastro-esophageal reflux disease without esophagitis: Secondary | ICD-10-CM | POA: Diagnosis present

## 2018-07-24 DIAGNOSIS — K222 Esophageal obstruction: Secondary | ICD-10-CM | POA: Diagnosis not present

## 2018-07-24 DIAGNOSIS — R1319 Other dysphagia: Secondary | ICD-10-CM

## 2018-07-24 MED ORDER — SODIUM CHLORIDE 0.9 % IV SOLN: 500.0000 mL | Freq: Once | INTRAVENOUS | Status: DC

## 2018-07-24 NOTE — Progress Notes (Signed)
Report to RN, VSS, adequate respirations noted, no c/o pain or discomfort O2 at 2 l Helix

## 2018-07-24 NOTE — Op Note (Signed)
[facility] Patient Name: Cheryl Mclaughlin Procedure Date: 07/24/2018 10:07 AM MRN: [id number] Endoscopist: Sherilyn Cooter L. Myrtie Neither , MD Age: 72 Referring MD:  Date of Birth: 1945/09/12 Gender: Female Account #: [id number] Procedure:                Upper GI endoscopy Indications:              Dysphagia (intermittent), Esophageal reflux                            (recently worsening) - not on chronic acid                            suppression) Medicines:                Monitored Anesthesia Care Procedure:                Pre-Anesthesia Assessment:                           - Prior to the procedure, a History and Physical                            was performed, and patient medications and                            allergies were reviewed. The patient's tolerance of                            previous anesthesia was also reviewed. The risks                            and benefits of the procedure and the sedation                            options and risks were discussed with the patient.                            All questions were answered, and informed consent                            was obtained. Anticoagulants: The patient has taken                            aspirin. It was decided not to withhold this                            medication prior to the procedure. ASA Grade                            Assessment: II - A patient with mild systemic                            disease. After reviewing the risks and benefits,  the patient was deemed in satisfactory condition to                            undergo the procedure.                           After obtaining informed consent, the endoscope was                            passed under direct vision. Throughout the                            procedure, the patient's blood pressure, pulse, and                            oxygen saturations were monitored continuously. The   Endoscope was introduced through the mouth, and                            advanced to the second part of duodenum. The upper                            GI endoscopy was accomplished without difficulty.                            The patient tolerated the procedure well. Scope In: Scope Out: Findings:                 A widely patent Schatzki ring was found at the                            gastroesophageal junction.                           A 2 cm hiatal hernia was present.                           There is no endoscopic evidence of Barrett's                            esophagus or esophagitis in the entire esophagus.                           The stomach was normal.                           The cardia and gastric fundus were normal on                            retroflexion.                           The examined duodenum was normal. Complications:            No immediate complications. Estimated Blood Loss:     Estimated blood loss: none. Impression:               -  Widely patent Schatzki ring.                           - 2 cm hiatal hernia.                           - Normal stomach.                           - Normal examined duodenum.                           - No specimens collected.                           No mechanical cause of dysphagia seen - likely mild                            reflux-related dysmotility. Recommendation:           - Patient has a contact number available for                            emergencies. The signs and symptoms of potential                            delayed complications were discussed with the                            patient. Return to normal activities tomorrow.                            Written discharge instructions were provided to the                            patient.                           - Resume previous diet.                           - Continue present medications. Patient to consider                            use of  pepcid or omeprazole. Cordarrius Coad L. Myrtie Neither, MD 07/24/2018 10:21:20 AM This report has been signed electronically.

## 2018-07-24 NOTE — Patient Instructions (Addendum)
YOU HAD AN ENDOSCOPIC PROCEDURE TODAY AT THE [facility]:   Refer to the procedure report that was given to you for any specific questions about what was found during the examination.  If the procedure report does not answer your questions, please call your gastroenterologist to clarify.  If you requested that your care partner not be given the details of your procedure findings, then the procedure report has been included in a sealed envelope for you to review at your convenience later.  YOU SHOULD EXPECT: Some feelings of bloating in the abdomen. Passage of more gas than usual.  Walking can help get rid of the air that was put into your GI tract during the procedure and reduce the bloating. If you had a lower endoscopy (such as a colonoscopy or flexible sigmoidoscopy) you may notice spotting of blood in your stool or on the toilet paper. If you underwent a bowel prep for your procedure, you may not have a normal bowel movement for a few days.  Please Note:  You might notice some irritation and congestion in your nose or some drainage.  This is from the oxygen used during your procedure.  There is no need for concern and it should clear up in a day or so.  SYMPTOMS TO REPORT IMMEDIATELY:    Following upper endoscopy (EGD)  Vomiting of blood or coffee ground material  New chest pain or pain under the shoulder blades  Painful or persistently difficult swallowing  New shortness of breath  Fever of 100F or higher  Black, tarry-looking stools  For urgent or emergent issues, a gastroenterologist can be reached at any hour by calling [phone].   DIET:  We do recommend a small meal at first, but then you may proceed to your regular diet.  Drink plenty of fluids but you should avoid alcoholic beverages for 24 hours.  ACTIVITY:  You should plan to take it easy for the rest of today and you should NOT DRIVE or use heavy machinery until tomorrow (because of the sedation medicines used  during the test).    FOLLOW UP: Our staff will call the number listed on your records the next business day following your procedure to check on you and address any questions or concerns that you may have regarding the information given to you following your procedure. If we do not reach you, we will leave a message.  However, if you are feeling well and you are not experiencing any problems, there is no need to return our call.  We will assume that you have returned to your regular daily activities without incident.  If any biopsies were taken you will be contacted by phone or by letter within the next 1-3 weeks.  Please call us at [phone] if you have not heard about the biopsies in 3 weeks.    SIGNATURES/CONFIDENTIALITY: You and/or your care partner have signed paperwork which will be entered into your electronic medical record.  These signatures attest to the fact that that the information above on your After Visit Summary has been reviewed and is understood.  Full responsibility of the confidentiality of this discharge information lies with you and/or your care-partner.     Handouts were given to your care partner on a Hiatal Hernia and GERD You may resume your current medications today. Per Dr. Myrtie Neitheranis no mechanical cause of difficulity swallowing seen - likely mild reflux-related dysmotility.. Please call if any questions or concerns.

## 2018-07-24 NOTE — Progress Notes (Signed)
No problems noted in the recovery room. maw 

## 2018-07-27 ENCOUNTER — Telehealth: Payer: Self-pay

## 2018-07-27 ENCOUNTER — Telehealth: Payer: Self-pay | Admitting: *Deleted

## 2018-07-27 NOTE — Telephone Encounter (Signed)
Follow up call, unable to leave a message. Fast busy signal.

## 2018-07-27 NOTE — Telephone Encounter (Signed)
Second attempt, tried both lines listed and both were busy with no option to leave VM.

## 2020-04-19 ENCOUNTER — Other Ambulatory Visit: Payer: Self-pay | Admitting: Family Medicine

## 2020-04-19 DIAGNOSIS — Z1231 Encounter for screening mammogram for malignant neoplasm of breast: Secondary | ICD-10-CM

## 2020-05-03 ENCOUNTER — Ambulatory Visit
Admission: RE | Admit: 2020-05-03 | Discharge: 2020-05-03 | Disposition: A | Discharge status: Home / Self Care | Payer: Medicare Other | Source: Ambulatory Visit | Attending: Family Medicine | Admitting: Family Medicine

## 2020-05-03 ENCOUNTER — Other Ambulatory Visit: Payer: Self-pay

## 2020-05-03 DIAGNOSIS — Z1231 Encounter for screening mammogram for malignant neoplasm of breast: Secondary | ICD-10-CM

## 2020-12-28 LAB — COLOGUARD: COLOGUARD: NEGATIVE

## 2022-02-07 ENCOUNTER — Other Ambulatory Visit: Payer: Self-pay | Admitting: Family Medicine

## 2022-02-07 DIAGNOSIS — Z1231 Encounter for screening mammogram for malignant neoplasm of breast: Secondary | ICD-10-CM

## 2022-02-22 ENCOUNTER — Ambulatory Visit
Admission: RE | Admit: 2022-02-22 | Discharge: 2022-02-22 | Disposition: A | Discharge status: Home / Self Care | Payer: Medicare PPO | Source: Ambulatory Visit | Attending: Family Medicine | Admitting: Family Medicine

## 2022-02-22 DIAGNOSIS — Z1231 Encounter for screening mammogram for malignant neoplasm of breast: Secondary | ICD-10-CM

## 2022-12-16 ENCOUNTER — Other Ambulatory Visit: Payer: Self-pay

## 2022-12-16 DIAGNOSIS — Z1231 Encounter for screening mammogram for malignant neoplasm of breast: Secondary | ICD-10-CM

## 2024-01-05 ENCOUNTER — Other Ambulatory Visit: Payer: Self-pay | Admitting: Family Medicine

## 2024-01-05 DIAGNOSIS — Z1231 Encounter for screening mammogram for malignant neoplasm of breast: Secondary | ICD-10-CM

## 2024-01-14 ENCOUNTER — Ambulatory Visit

## 2024-01-26 ENCOUNTER — Ambulatory Visit
